# Patient Record
Sex: Male | Born: 1935 | Race: White | Hispanic: No | Marital: Married | Smoking: Never smoker
Health system: Southern US, Community
[De-identification: ages and names within clinical notes are randomized; demographics above are authoritative.]

## PROBLEM LIST (undated history)

## (undated) DIAGNOSIS — I10 Essential (primary) hypertension: Secondary | ICD-10-CM

## (undated) HISTORY — DX: Essential (primary) hypertension: I10

---

## 2000-04-16 ENCOUNTER — Encounter: Payer: Self-pay | Admitting: Thoracic Surgery (Cardiothoracic Vascular Surgery)

## 2000-04-20 ENCOUNTER — Encounter: Payer: Self-pay | Admitting: Thoracic Surgery (Cardiothoracic Vascular Surgery)

## 2000-04-20 ENCOUNTER — Inpatient Hospital Stay (HOSPITAL_COMMUNITY)
Admission: RE | Admit: 2000-04-20 | Discharge: 2000-04-25 | Payer: Self-pay | Admitting: Thoracic Surgery (Cardiothoracic Vascular Surgery)

## 2000-04-21 ENCOUNTER — Encounter: Payer: Self-pay | Admitting: Thoracic Surgery (Cardiothoracic Vascular Surgery)

## 2000-04-22 ENCOUNTER — Encounter: Payer: Self-pay | Admitting: Thoracic Surgery (Cardiothoracic Vascular Surgery)

## 2000-04-23 ENCOUNTER — Encounter: Payer: Self-pay | Admitting: Thoracic Surgery (Cardiothoracic Vascular Surgery)

## 2009-06-05 ENCOUNTER — Ambulatory Visit: Payer: Self-pay | Admitting: Gastroenterology

## 2009-07-12 ENCOUNTER — Ambulatory Visit: Payer: Self-pay | Admitting: Gastroenterology

## 2013-10-02 DIAGNOSIS — G4733 Obstructive sleep apnea (adult) (pediatric): Secondary | ICD-10-CM | POA: Insufficient documentation

## 2013-10-02 DIAGNOSIS — I10 Essential (primary) hypertension: Secondary | ICD-10-CM | POA: Insufficient documentation

## 2013-10-02 DIAGNOSIS — M069 Rheumatoid arthritis, unspecified: Secondary | ICD-10-CM | POA: Insufficient documentation

## 2013-10-02 DIAGNOSIS — N400 Enlarged prostate without lower urinary tract symptoms: Secondary | ICD-10-CM | POA: Insufficient documentation

## 2013-10-02 DIAGNOSIS — I2510 Atherosclerotic heart disease of native coronary artery without angina pectoris: Secondary | ICD-10-CM | POA: Insufficient documentation

## 2013-12-02 ENCOUNTER — Ambulatory Visit: Payer: Self-pay | Admitting: Rheumatology

## 2014-05-05 DIAGNOSIS — I700 Atherosclerosis of aorta: Secondary | ICD-10-CM | POA: Insufficient documentation

## 2014-12-01 ENCOUNTER — Encounter: Payer: Self-pay | Admitting: Podiatry

## 2014-12-01 ENCOUNTER — Ambulatory Visit (INDEPENDENT_AMBULATORY_CARE_PROVIDER_SITE_OTHER): Payer: Medicare Other | Admitting: Podiatry

## 2014-12-01 VITALS — BP 120/64 | HR 54 | Resp 16 | Ht 68.0 in | Wt 160.0 lb

## 2014-12-01 DIAGNOSIS — M79606 Pain in leg, unspecified: Secondary | ICD-10-CM

## 2014-12-01 DIAGNOSIS — L03031 Cellulitis of right toe: Secondary | ICD-10-CM

## 2014-12-01 DIAGNOSIS — M8080XA Other osteoporosis with current pathological fracture, unspecified site, initial encounter for fracture: Secondary | ICD-10-CM | POA: Insufficient documentation

## 2014-12-01 DIAGNOSIS — B351 Tinea unguium: Secondary | ICD-10-CM | POA: Diagnosis not present

## 2014-12-01 NOTE — Progress Notes (Signed)
   Subjective:    Patient ID: Philip Patel, male    DOB: December 13, 1922, 81 y.o.   MRN: [id number]  HPI  the right great toenail is real loose, i think i have a fungus , the nail gets caught up on my sock and it hurts real bad ,so i started wearing sandals   Review of Systems     Objective:   Physical Exam: I have reviewed his past history medications allergies surgery social history and review of systems. Pulses are strongly palpable. Neurologic sensorium is intact per Semmes-Weinstein monofilament. Deep tendon reflexes are intact bilaterally muscle strength was 5 over 5 dorsiflexion plantar flexors and inverters everters onto the musculatures intact. Mild flexible hammertoe deformities noted bilateral. Nails are thick yellow dystrophic onychomycotic and painful palpation as well as debridement. No open lesions no open wounds Infection of the toenail plates.        Assessment & Plan:  Assessment: Onychomycosis with pain in limb bilateral.  Plan: Debridement of nails of thickness and length. Follow-up with me as needed

## 2015-03-06 ENCOUNTER — Ambulatory Visit (INDEPENDENT_AMBULATORY_CARE_PROVIDER_SITE_OTHER): Payer: Medicare Other | Admitting: Podiatry

## 2015-03-06 DIAGNOSIS — M79606 Pain in leg, unspecified: Secondary | ICD-10-CM | POA: Diagnosis not present

## 2015-03-06 DIAGNOSIS — B351 Tinea unguium: Secondary | ICD-10-CM

## 2015-03-06 NOTE — Progress Notes (Signed)
Subjective: 81 y.o. returns the office today for painful, elongated, thickened toenails which he is unable to trim himself. Denies any redness or drainage around the nails. Denies any acute changes since last appointment and no new complaints today. Denies any systemic complaints such as fevers, chills, nausea, vomiting.   Objective: AAO 3, NAD DP/PT pulses palpable, CRT less than 3 seconds Nails hypertrophic, dystrophic, elongated, brittle, discolored 10. There is tenderness overlying the nails 1-5 bilaterally. There is no surrounding erythema or drainage along the nail sites. No open lesions or pre-ulcerative lesions are identified. No other areas of tenderness bilateral lower extremities. No overlying edema, erythema, increased warmth. No pain with calf compression, swelling, warmth, erythema.  Assessment: Patient presents with symptomatic onychomycosis  Plan: -Treatment options including alternatives, risks, complications were discussed -Nails sharply debrided 10 without complication/bleeding. -Discussed daily foot inspection. If there are any changes, to call the office immediately.  -Follow-up in 3 months or sooner if any problems are to arise. In the meantime, encouraged to call the office with any questions, concerns, changes symptoms.  Ovid Curd, DPM

## 2015-06-05 ENCOUNTER — Encounter: Payer: Self-pay | Admitting: Podiatry

## 2015-06-05 ENCOUNTER — Ambulatory Visit (INDEPENDENT_AMBULATORY_CARE_PROVIDER_SITE_OTHER): Payer: Medicare Other | Admitting: Podiatry

## 2015-06-05 DIAGNOSIS — B351 Tinea unguium: Secondary | ICD-10-CM | POA: Diagnosis not present

## 2015-06-05 DIAGNOSIS — M79606 Pain in leg, unspecified: Secondary | ICD-10-CM | POA: Diagnosis not present

## 2015-06-05 NOTE — Progress Notes (Signed)
Subjective: 81 y.o. returns the office today for painful, elongated, thickened toenails which he is unable to trim himself. Denies any redness or drainage around the nails. Denies any acute changes since last appointment and no new complaints today. Denies any systemic complaints such as fevers, chills, nausea, vomiting.   Objective: AAO 3, NAD DP/PT pulses palpable, CRT less than 3 seconds Nails hypertrophic, dystrophic, elongated, brittle, discolored 10. There is tenderness overlying the nails 1-5 bilaterally. There is no surrounding erythema or drainage along the nail sites. No open lesions or pre-ulcerative lesions are identified. No other areas of tenderness bilateral lower extremities. No overlying edema, erythema, increased warmth. No pain with calf compression, swelling, warmth, erythema.  Assessment: Patient presents with symptomatic onychomycosis  Plan: -Treatment options including alternatives, risks, complications were discussed -Nails sharply debrided 10 without complication/bleeding. -Discussed daily foot inspection. If there are any changes, to call the office immediately.  -Follow-up in 3 months or sooner if any problems are to arise. In the meantime, encouraged to call the office with any questions, concerns, changes symptoms.  Matthew Wagoner, DPM  

## 2015-09-04 ENCOUNTER — Encounter: Payer: Self-pay | Admitting: Podiatry

## 2015-09-04 ENCOUNTER — Ambulatory Visit (INDEPENDENT_AMBULATORY_CARE_PROVIDER_SITE_OTHER): Payer: Medicare Other | Admitting: Podiatry

## 2015-09-04 DIAGNOSIS — M79606 Pain in leg, unspecified: Secondary | ICD-10-CM | POA: Diagnosis not present

## 2015-09-04 DIAGNOSIS — B351 Tinea unguium: Secondary | ICD-10-CM | POA: Diagnosis not present

## 2015-09-04 NOTE — Progress Notes (Signed)
Patient ID: Philip Patel, male   DOB: 1922-09-04, 82 y.o.   MRN: [id number]  Subjective: 82 y.o. returns the office today for painful, elongated, thickened toenails which he is unable to trim himself. Denies any redness or drainage around the nails. Denies any acute changes since last appointment and no new complaints today. Denies any systemic complaints such as fevers, chills, nausea, vomiting.   Objective: AAO 3, NAD DP/PT pulses palpable, CRT less than 3 seconds Nails hypertrophic, dystrophic, elongated, brittle, discolored 10. There is tenderness overlying the nails 1-5 bilaterally. There is no surrounding erythema or drainage along the nail sites. No open lesions or pre-ulcerative lesions are identified. No other areas of tenderness bilateral lower extremities. No overlying edema, erythema, increased warmth. No pain with calf compression, swelling, warmth, erythema.  Assessment: Patient presents with symptomatic onychomycosis  Plan: -Treatment options including alternatives, risks, complications were discussed -Nails sharply debrided 10 without complication/bleeding. -Discussed daily foot inspection. If there are any changes, to call the office immediately.  -Follow-up in 3 months or sooner if any problems are to arise. In the meantime, encouraged to call the office with any questions, concerns, changes symptoms.  Ovid Curd, DPM

## 2015-12-11 ENCOUNTER — Ambulatory Visit (INDEPENDENT_AMBULATORY_CARE_PROVIDER_SITE_OTHER): Payer: Medicare Other | Admitting: Podiatry

## 2015-12-11 ENCOUNTER — Encounter: Payer: Self-pay | Admitting: Podiatry

## 2015-12-11 DIAGNOSIS — M79606 Pain in leg, unspecified: Secondary | ICD-10-CM | POA: Diagnosis not present

## 2015-12-11 DIAGNOSIS — B351 Tinea unguium: Secondary | ICD-10-CM | POA: Diagnosis not present

## 2015-12-11 NOTE — Progress Notes (Signed)
Patient ID: Philip Patel, male   DOB: 09/01/1922, 82 y.o.   MRN: [id number]  Subjective: 82 y.o. returns the office today for painful, elongated, thickened toenails which he is unable to trim himself. Denies any redness or drainage around the nails. Denies any acute changes since last appointment and no new complaints today. Denies any systemic complaints such as fevers, chills, nausea, vomiting.   Objective: AAO 3, NAD DP/PT pulses palpable, CRT less than 3 seconds Nails hypertrophic, dystrophic, elongated, brittle, discolored 10. There is tenderness overlying the nails 1-5 bilaterally. There is no surrounding erythema or drainage along the nail sites. No open lesions or pre-ulcerative lesions are identified. No other areas of tenderness bilateral lower extremities. No overlying edema, erythema, increased warmth. No pain with calf compression, swelling, warmth, erythema.  Assessment: Patient presents with symptomatic onychomycosis  Plan: -Treatment options including alternatives, risks, complications were discussed -Nails sharply debrided 10 without complication/bleeding. -Discussed daily foot inspection. If there are any changes, to call the office immediately.  -Follow-up in 3 months or sooner if any problems are to arise. In the meantime, encouraged to call the office with any questions, concerns, changes symptoms.  Matthew Wagoner, DPM  

## 2016-02-08 ENCOUNTER — Encounter: Payer: Self-pay | Admitting: Emergency Medicine

## 2016-02-08 ENCOUNTER — Emergency Department
Admission: EM | Admit: 2016-02-08 | Discharge: 2016-02-08 | Disposition: A | Discharge status: Home / Self Care | Payer: Medicare Other | Attending: Emergency Medicine | Admitting: Emergency Medicine

## 2016-02-08 DIAGNOSIS — Z79899 Other long term (current) drug therapy: Secondary | ICD-10-CM | POA: Diagnosis not present

## 2016-02-08 DIAGNOSIS — S00512A Abrasion of oral cavity, initial encounter: Secondary | ICD-10-CM | POA: Diagnosis not present

## 2016-02-08 DIAGNOSIS — Y939 Activity, unspecified: Secondary | ICD-10-CM | POA: Insufficient documentation

## 2016-02-08 DIAGNOSIS — I10 Essential (primary) hypertension: Secondary | ICD-10-CM | POA: Insufficient documentation

## 2016-02-08 DIAGNOSIS — Z7982 Long term (current) use of aspirin: Secondary | ICD-10-CM | POA: Diagnosis not present

## 2016-02-08 DIAGNOSIS — K1120 Sialoadenitis, unspecified: Secondary | ICD-10-CM | POA: Diagnosis not present

## 2016-02-08 DIAGNOSIS — Y929 Unspecified place or not applicable: Secondary | ICD-10-CM | POA: Diagnosis not present

## 2016-02-08 DIAGNOSIS — X58XXXA Exposure to other specified factors, initial encounter: Secondary | ICD-10-CM | POA: Diagnosis not present

## 2016-02-08 DIAGNOSIS — R220 Localized swelling, mass and lump, head: Secondary | ICD-10-CM | POA: Diagnosis present

## 2016-02-08 DIAGNOSIS — Y999 Unspecified external cause status: Secondary | ICD-10-CM | POA: Insufficient documentation

## 2016-02-08 NOTE — ED Notes (Addendum)
Pt reports that his tongue is sore and causing him to have difficulty swallowing and eating for the last few weeks - pt denies sore throat - pt denies shortness of breath - pt is A&O x4 and respirations are even and unlabored - pt tongue on assessment appears swollen (more on right side) but no discoloration noted

## 2016-02-08 NOTE — ED Triage Notes (Signed)
Pt c/o tongue swelling. Started a couple weeks ago but got significantly worse today. Pt drooling while attempting to talk to RN. Sounds like difficult for pt to talk. He reports it is a little hard r/t tongue. No respiratory distress.

## 2016-02-08 NOTE — ED Provider Notes (Signed)
[facility] Emergency Department Provider Note  ____________________________________________  Time seen: Approximately 6:16 PM  I have reviewed the triage vital signs and the nursing notes.   HISTORY  Chief Complaint Oral Swelling    HPI Philip Patel is a 82 y.o. male who complains of swelling under his tongue for the past 3 or 4 days. It feels like its getting worse. He also has cut his tongue on his teeth and that makes it really sore. He is able to eat soup but eating hard foods is not comfortable. No trouble breathing. No throat pain or difficulty swallowing. No drooling. No vomiting. No fevers or chills. No chest pain or shortness of breath.  Review of the electronic medical record reveals the patient was seen by his primary care doctor twice in the last week. He was seen a week ago and prescribed Levaquin for bronchitis. He then developed thrush and was seen 3 days later on August 14 was given nystatin. The patient reports that he stopped taking the Levaquin because he didn't like that he gave him thrush.     Past Medical History:  Diagnosis Date  . Hypertension      Patient Active Problem List   Diagnosis Date Noted  . Osteoporosis with fracture 12/01/2014  . Abdominal aortic atherosclerosis (HCC) 05/05/2014  . Benign fibroma of prostate 10/02/2013  . Arteriosclerosis of coronary artery 10/02/2013  . Benign essential HTN 10/02/2013  . Obstructive apnea 10/02/2013  . Arthritis or polyarthritis, rheumatoid (HCC) 10/02/2013     History reviewed. No pertinent surgical history.   Prior to Admission medications   Medication Sig Start Date End Date Taking? Authorizing Provider  alendronate (FOSAMAX) 70 MG tablet  10/09/14   Historical Provider, MD  aspirin EC 81 MG tablet Take by mouth.    Historical Provider, MD  Cholecalciferol (D 2000) 2000 UNITS TABS Take by mouth.    Historical Provider, MD  lisinopril (PRINIVIL,ZESTRIL) 5 MG tablet   10/09/14   Historical Provider, MD  lovastatin (MEVACOR) 40 MG tablet  09/04/14   Historical Provider, MD  methotrexate (RHEUMATREX) 2.5 MG tablet  11/13/14   Historical Provider, MD  metoprolol tartrate (LOPRESSOR) 25 MG tablet  09/25/14   Historical Provider, MD  pantoprazole (PROTONIX) 40 MG tablet  11/13/14   Historical Provider, MD     Allergies Penicillin v potassium   History reviewed. No pertinent family history.  Social History Social History  Substance Use Topics  . Smoking status: Never Smoker  . Smokeless tobacco: Not on file  . Alcohol use Not on file    Review of Systems  Constitutional:   No fever or chills.  ENT:   Pain in right tongue. Cardiovascular:   No chest pain. Respiratory:   No dyspnea or cough. Gastrointestinal:   Negative for abdominal pain, vomiting and diarrhea.  . Musculoskeletal:   Negative for focal pain or swelling  10-point ROS otherwise negative.  ____________________________________________   PHYSICAL EXAM:  VITAL SIGNS: ED Triage Vitals [02/08/16 1730]  Enc Vitals Group     BP 122/63     Pulse Rate 68     Resp 16     Temp 97.9 F (36.6 C)     Temp Source Oral     SpO2 100 %     Weight 150 lb (68 kg)     Height 5\' 8"  (1.727 m)     Head Circumference      Peak Flow  Pain Score 8     Pain Loc      Pain Edu?      Excl. in GC?     Vital signs reviewed, nursing assessments reviewed.   Constitutional:   Alert and oriented. Well appearing and in no distress. Eyes:   No scleral icterus. No conjunctival pallor. PERRL. EOMI.  No nystagmus. ENT   Head:   Normocephalic and atraumatic.   Nose:   No congestion/rhinnorhea. No septal hematoma   Mouth/Throat:   MMM, no pharyngeal erythema. No peritonsillar mass. Uvula is surgically absent. Diffuse dental decay, no swelling fluctuance or drainage from the gingiva. The right Wharton's duct is slightly swollen with watery edema. No induration. No purulent drainage. No palpable  stone. It is tender to the touch. It is no elevation of the tongue. There are some slight abrasions to the bottom of the tongue distally without laceration or inflammatory changes.   Neck:   No stridor. No SubQ emphysema. No meningismus. Palpation of the soft tissues under the jaw soft without any swelling or asymmetry. Hematological/Lymphatic/Immunilogical:   No cervical lymphadenopathy. Cardiovascular:   RRR. Symmetric bilateral radial and DP pulses.  No murmurs.  Respiratory:   Normal respiratory effort without tachypnea nor retractions. Breath sounds are clear and equal bilaterally. No wheezes/rales/rhonchi. Gastrointestinal:   Soft and nontender. Non distended. There is no CVA tenderness.  No rebound, rigidity, or guarding. Genitourinary:   deferred Musculoskeletal:   Nontender with normal range of motion in all extremities. No joint effusions.  No lower extremity tenderness.  No edema. Neurologic:   Normal speech and language.  CN 2-10 normal. Motor grossly intact. No gross focal neurologic deficits are appreciated.  Skin:    Skin is warm, dry and intact. No rash noted.  No petechiae, purpura, or bullae.  ____________________________________________    LABS (pertinent positives/negatives) (all labs ordered are listed, but only abnormal results are displayed) Labs Reviewed - No data to display ____________________________________________   EKG    ____________________________________________    RADIOLOGY    ____________________________________________   PROCEDURES Procedures  ____________________________________________   INITIAL IMPRESSION / ASSESSMENT AND PLAN / ED COURSE  Pertinent labs & imaging results that were available during my care of the patient were reviewed by me and considered in my medical decision making (see chart for details).  Patient very well-appearing no acute distress. Presents with pain in the floor of the mouth consistent with  sialoadenitis. No evidence of RPA PTA Ludwig angina. No evidence of any abscess. No facial cellulitis. Levaquin should cover any potential infectious agents causing edema but I think that this is primarily mechanical, possible small sialolithiasis. Patient counseled on warm tea, lemon candy, continue Levaquin, follow up with primary care, follow up with ENT in a week if symptoms have not resolved by then. Return to ED if anything worsens. Usual return precautions given. No evidence of any airway compromise.     Clinical Course   ____________________________________________   FINAL CLINICAL IMPRESSION(S) / ED DIAGNOSES  Final diagnoses:  Sialadenitis  Abrasion of tongue, initial encounter       Portions of this note were generated with dragon dictation software. Dictation errors may occur despite best attempts at proofreading.    Sharman Cheek, MD 02/08/16 [phone]

## 2016-02-08 NOTE — Discharge Instructions (Signed)
Continue the Levaquin and nystatin prescribed by your doctor about a week ago. Drink hot tea, and suck on hard lemonade candies to promote increased blood flow to the inflamed area of your salivary gland under the tongue and increased saliva production to help flush out the duct. If symptoms do not resolve in the next 5 days, follow up with ENT.

## 2016-02-29 ENCOUNTER — Emergency Department
Admission: EM | Admit: 2016-02-29 | Discharge: 2016-02-29 | Disposition: A | Discharge status: Home / Self Care | Payer: Medicare Other | Attending: Emergency Medicine | Admitting: Emergency Medicine

## 2016-02-29 ENCOUNTER — Encounter: Payer: Self-pay | Admitting: Emergency Medicine

## 2016-02-29 DIAGNOSIS — I2510 Atherosclerotic heart disease of native coronary artery without angina pectoris: Secondary | ICD-10-CM | POA: Insufficient documentation

## 2016-02-29 DIAGNOSIS — Z7982 Long term (current) use of aspirin: Secondary | ICD-10-CM | POA: Insufficient documentation

## 2016-02-29 DIAGNOSIS — R197 Diarrhea, unspecified: Secondary | ICD-10-CM | POA: Diagnosis not present

## 2016-02-29 DIAGNOSIS — I10 Essential (primary) hypertension: Secondary | ICD-10-CM | POA: Insufficient documentation

## 2016-02-29 LAB — URINALYSIS COMPLETE WITH MICROSCOPIC (ARMC ONLY)
BILIRUBIN URINE: NEGATIVE
Bacteria, UA: NONE SEEN
GLUCOSE, UA: NEGATIVE mg/dL
Hgb urine dipstick: NEGATIVE
KETONES UR: NEGATIVE mg/dL
Leukocytes, UA: NEGATIVE
NITRITE: NEGATIVE
Protein, ur: NEGATIVE mg/dL
SPECIFIC GRAVITY, URINE: 1.012 (ref 1.005–1.030)
Squamous Epithelial / LPF: NONE SEEN
WBC, UA: NONE SEEN WBC/hpf (ref 0–5)
pH: 5 (ref 5.0–8.0)

## 2016-02-29 LAB — CBC
HCT: 30 % — ABNORMAL LOW (ref 40.0–52.0)
HEMOGLOBIN: 10.3 g/dL — AB (ref 13.0–18.0)
MCH: 34.5 pg — AB (ref 26.0–34.0)
MCHC: 34.3 g/dL (ref 32.0–36.0)
MCV: 100.8 fL — ABNORMAL HIGH (ref 80.0–100.0)
PLATELETS: 336 10*3/uL (ref 150–440)
RBC: 2.98 MIL/uL — ABNORMAL LOW (ref 4.40–5.90)
RDW: 19 % — ABNORMAL HIGH (ref 11.5–14.5)
WBC: 10.8 10*3/uL — ABNORMAL HIGH (ref 3.8–10.6)

## 2016-02-29 LAB — COMPREHENSIVE METABOLIC PANEL
ALK PHOS: 88 U/L (ref 38–126)
ALT: 12 U/L — AB (ref 17–63)
ANION GAP: 7 (ref 5–15)
AST: 25 U/L (ref 15–41)
Albumin: 3.4 g/dL — ABNORMAL LOW (ref 3.5–5.0)
BILIRUBIN TOTAL: 0.8 mg/dL (ref 0.3–1.2)
BUN: 25 mg/dL — ABNORMAL HIGH (ref 6–20)
CALCIUM: 8.8 mg/dL — AB (ref 8.9–10.3)
CO2: 23 mmol/L (ref 22–32)
CREATININE: 0.92 mg/dL (ref 0.61–1.24)
Chloride: 103 mmol/L (ref 101–111)
GFR calc Af Amer: >60 mL/min (ref >60)
GFR calc non Af Amer: >60 mL/min (ref >60)
GLUCOSE: 100 mg/dL — AB (ref 65–99)
Potassium: 4.4 mmol/L (ref 3.5–5.1)
SODIUM: 133 mmol/L — AB (ref 135–145)
TOTAL PROTEIN: 7.1 g/dL (ref 6.5–8.1)

## 2016-02-29 LAB — LIPASE, BLOOD: Lipase: 30 U/L (ref 11–51)

## 2016-02-29 NOTE — ED Provider Notes (Signed)
[facility] Emergency Department Provider Note ____________________________________________   I have reviewed the triage vital signs and the triage nursing note.  HISTORY  Chief Complaint Diarrhea   Historian Patient and wife  HPI Philip Patel is a 82 y.o. male here after calling gi physician and referred to ED for ongoing complaint of diarrhea.  He was being seen for approx 4 weeks of watery diarrhea -- no blood, and no abdominal pain, but multiple episodes per day.  Reports he was started on a diarrhea medicine about 3 days ago and this morning after having continued diarrhea, he called the office, and was told to go to the ER for further evaluation. However now he states that the last few bowel movements is had today have actually been a little firmer. No fever. No chest pain. No nausea or vomiting.    Past Medical History:  Diagnosis Date  . Hypertension     Patient Active Problem List   Diagnosis Date Noted  . Osteoporosis with fracture 12/01/2014  . Abdominal aortic atherosclerosis (HCC) 05/05/2014  . Benign fibroma of prostate 10/02/2013  . Arteriosclerosis of coronary artery 10/02/2013  . Benign essential HTN 10/02/2013  . Obstructive apnea 10/02/2013  . Arthritis or polyarthritis, rheumatoid (HCC) 10/02/2013    History reviewed. No pertinent surgical history.  Prior to Admission medications   Medication Sig Start Date End Date Taking? Authorizing Provider  alendronate (FOSAMAX) 70 MG tablet  10/09/14   Historical Provider, MD  aspirin EC 81 MG tablet Take by mouth.    Historical Provider, MD  Cholecalciferol (D 2000) 2000 UNITS TABS Take by mouth.    Historical Provider, MD  lisinopril (PRINIVIL,ZESTRIL) 5 MG tablet  10/09/14   Historical Provider, MD  lovastatin (MEVACOR) 40 MG tablet  09/04/14   Historical Provider, MD  methotrexate (RHEUMATREX) 2.5 MG tablet  11/13/14   Historical Provider, MD  metoprolol tartrate (LOPRESSOR) 25 MG  tablet  09/25/14   Historical Provider, MD  pantoprazole (PROTONIX) 40 MG tablet  11/13/14   Historical Provider, MD    Allergies  Allergen Reactions  . Penicillin V Potassium Rash    No family history on file.  Social History Social History  Substance Use Topics  . Smoking status: Never Smoker  . Smokeless tobacco: Never Used  . Alcohol use No    Review of Systems  Constitutional: Negative for fever. Eyes: Negative for visual changes. ENT: Negative for sore throat. Cardiovascular: Negative for chest pain. Respiratory: Negative for shortness of breath. Gastrointestinal: Negative for abdominal pain. Genitourinary: Negative for dysuria. Musculoskeletal: Negative for back pain. Skin: Negative for rash. Neurological: Negative for headache. 10 point Review of Systems otherwise negative ____________________________________________   PHYSICAL EXAM:  VITAL SIGNS: ED Triage Vitals  Enc Vitals Group     BP 02/29/16 1515 125/63     Pulse Rate 02/29/16 1515 82     Resp 02/29/16 1515 20     Temp 02/29/16 1515 97.7 F (36.5 C)     Temp Source 02/29/16 1515 Oral     SpO2 02/29/16 1515 96 %     Weight 02/29/16 1514 150 lb (68 kg)     Height 02/29/16 1514 5\' 8"  (1.727 m)     Head Circumference --      Peak Flow --      Pain Score 02/29/16 1935 0     Pain Loc --      Pain Edu? --      Excl. in GC? --  Constitutional: Alert and oriented. Well appearing and in no distress.A little hard of hearing.  HEENT   Head: Normocephalic and atraumatic.      Eyes: Conjunctivae are normal. PERRL. Normal extraocular movements.      Ears:         Nose: No congestion/rhinnorhea.   Mouth/Throat: Mucous membranes are moist.   Neck: No stridor. Cardiovascular/Chest: Normal rate, regular rhythm.  No murmurs, rubs, or gallops. Respiratory: Normal respiratory effort without tachypnea nor retractions. Breath sounds are clear and equal bilaterally. No  wheezes/rales/rhonchi. Gastrointestinal: Soft. No distention, no guarding, no rebound. Nontender.    Genitourinary/rectal:Deferred Musculoskeletal: Nontender with normal range of motion in all extremities. No joint effusions.  No lower extremity tenderness.  No edema. Neurologic:  Normal speech and language. No gross or focal neurologic deficits are appreciated. Skin:  Skin is warm, dry and intact. No rash noted. Psychiatric: Mood and affect are normal. Speech and behavior are normal. Patient exhibits appropriate insight and judgment.   ____________________________________________  LABS (pertinent positives/negatives)  Labs Reviewed  COMPREHENSIVE METABOLIC PANEL - Abnormal; Notable for the following:       Result Value   Sodium 133 (*)    Glucose, Bld 100 (*)    BUN 25 (*)    Calcium 8.8 (*)    Albumin 3.4 (*)    ALT 12 (*)    All other components within normal limits  CBC - Abnormal; Notable for the following:    WBC 10.8 (*)    RBC 2.98 (*)    Hemoglobin 10.3 (*)    HCT 30.0 (*)    MCV 100.8 (*)    MCH 34.5 (*)    RDW 19.0 (*)    All other components within normal limits  URINALYSIS COMPLETEWITH MICROSCOPIC ([facility] ONLY) - Abnormal; Notable for the following:    Color, Urine YELLOW (*)    APPearance CLEAR (*)    All other components within normal limits  LIPASE, BLOOD    ____________________________________________    EKG I, Governor Rooks, MD, the attending physician have personally viewed and interpreted all ECGs.  None ____________________________________________  RADIOLOGY All Xrays were viewed by me. Imaging interpreted by Radiologist.  None __________________________________________  PROCEDURES  Procedure(s) performed: None  Critical Care performed: None  ____________________________________________   ED COURSE / ASSESSMENT AND PLAN  Pertinent labs & imaging results that were available during my care of the patient were reviewed by me and  considered in my medical decision making (see chart for details).    Mr. Benincasa was referred by phone due to continued diarrhea, but since here reports no diarrhea - stools are firmer.  No fevers, no bloody stools, hemoglobin consistent with prior.  Minimally elevated wbc, uncertain significance.  Without fever, pain, or worsening anemia, I am not recommending ct imaging at this point.    UA negative.  Patient feels maybe new med is working now and ok for discharge home tonight.    CONSULTATIONS:   None   Patient / Family / Caregiver informed of clinical course, medical decision-making process, and agree with plan.   I discussed return precautions, follow-up instructions, and discharge instructions with patient and/or family.   ___________________________________________   FINAL CLINICAL IMPRESSION(S) / ED DIAGNOSES   Final diagnoses:  Diarrhea in adult patient              Note: This dictation was prepared with Dragon dictation. Any transcriptional errors that result from this process are unintentional    Lurena Joiner  Shaune Pollack, MD 02/29/16 [phone]

## 2016-02-29 NOTE — ED Triage Notes (Signed)
Presents with diarrhea for the past 3-4 weeks  Sent in by PCP for possible dehydration

## 2016-02-29 NOTE — ED Notes (Signed)
Pt states he started a new antibiotic yesterday and has been taking one prior to starting the new antibiotic. Pt also states he has had a cough for about a week that he was prescribed the antibiotic for.

## 2016-02-29 NOTE — Discharge Instructions (Signed)
You were evaluated tonight for ongoing diarrhea, urinary exam and evaluation are reassuring in the emergency department tonight.  As we discussed, return to the emergency room for any fever, black or bloody stool, concern for dehydration, abdominal pain, or any other symptoms concerning to you.  Please follow up with your primary care doctor next available, Monday or Tuesday. Please call the office to make the appointment.

## 2016-03-13 ENCOUNTER — Ambulatory Visit (INDEPENDENT_AMBULATORY_CARE_PROVIDER_SITE_OTHER): Payer: Medicare Other | Admitting: Podiatry

## 2016-03-13 ENCOUNTER — Encounter: Payer: Self-pay | Admitting: Podiatry

## 2016-03-13 DIAGNOSIS — B351 Tinea unguium: Secondary | ICD-10-CM

## 2016-03-13 DIAGNOSIS — M79606 Pain in leg, unspecified: Secondary | ICD-10-CM | POA: Diagnosis not present

## 2016-03-13 NOTE — Progress Notes (Signed)
Patient ID: Philip Patel, male   DOB: 06/18/1923, 82 y.o.   MRN: [id number]  Subjective: 82 y.o. returns the office today for painful, elongated, thickened toenails which he is unable to trim himself. Denies any redness or drainage around the nails. Denies any acute changes since last appointment and no new complaints today. Denies any systemic complaints such as fevers, chills, nausea, vomiting.   Objective: AAO 3, NAD DP/PT pulses palpable, CRT less than 3 seconds Nails hypertrophic, dystrophic, elongated, brittle, discolored 10. There is tenderness overlying the nails 1-5 bilaterally. The left hallux toenail is very loosely underlying nail bed and only attached the very proximal nail border. The toenail came off during debridement. There is no surrounding erythema or drainage along the nail sites. No open lesions or pre-ulcerative lesions are identified. No other areas of tenderness bilateral lower extremities. No overlying edema, erythema, increased warmth. No pain with calf compression, swelling, warmth, erythema.  Assessment: Patient presents with symptomatic onychomycosis  Plan: -Treatment options including alternatives, risks, complications were discussed -Nails sharply debrided 10 without complication/bleeding. -Discussed daily foot inspection. If there are any changes, to call the office immediately.  -Follow-up in 3 months or sooner if any problems are to arise. In the meantime, encouraged to call the office with any questions, concerns, changes symptoms.  Ovid Curd, DPM

## 2016-03-19 ENCOUNTER — Emergency Department
Admission: EM | Admit: 2016-03-19 | Discharge: 2016-03-23 | Disposition: E | Payer: No Typology Code available for payment source | Attending: Student in an Organized Health Care Education/Training Program | Admitting: Student in an Organized Health Care Education/Training Program

## 2016-03-19 ENCOUNTER — Encounter: Payer: Self-pay | Admitting: Emergency Medicine

## 2016-03-19 ENCOUNTER — Emergency Department: Payer: No Typology Code available for payment source

## 2016-03-19 DIAGNOSIS — I2510 Atherosclerotic heart disease of native coronary artery without angina pectoris: Secondary | ICD-10-CM | POA: Insufficient documentation

## 2016-03-19 DIAGNOSIS — I10 Essential (primary) hypertension: Secondary | ICD-10-CM | POA: Insufficient documentation

## 2016-03-19 DIAGNOSIS — J95821 Acute postprocedural respiratory failure: Secondary | ICD-10-CM

## 2016-03-19 DIAGNOSIS — Y92410 Unspecified street and highway as the place of occurrence of the external cause: Secondary | ICD-10-CM | POA: Diagnosis not present

## 2016-03-19 DIAGNOSIS — R579 Shock, unspecified: Secondary | ICD-10-CM | POA: Insufficient documentation

## 2016-03-19 DIAGNOSIS — T148 Other injury of unspecified body region: Secondary | ICD-10-CM | POA: Diagnosis not present

## 2016-03-19 DIAGNOSIS — Z79899 Other long term (current) drug therapy: Secondary | ICD-10-CM | POA: Diagnosis not present

## 2016-03-19 DIAGNOSIS — Y9389 Activity, other specified: Secondary | ICD-10-CM | POA: Insufficient documentation

## 2016-03-19 DIAGNOSIS — R55 Syncope and collapse: Secondary | ICD-10-CM | POA: Diagnosis present

## 2016-03-19 DIAGNOSIS — Y999 Unspecified external cause status: Secondary | ICD-10-CM | POA: Diagnosis not present

## 2016-03-19 DIAGNOSIS — Z7982 Long term (current) use of aspirin: Secondary | ICD-10-CM | POA: Insufficient documentation

## 2016-03-19 DIAGNOSIS — Z9889 Other specified postprocedural states: Secondary | ICD-10-CM

## 2016-03-19 DIAGNOSIS — T07XXXA Unspecified multiple injuries, initial encounter: Secondary | ICD-10-CM

## 2016-03-19 LAB — CBC WITH DIFFERENTIAL/PLATELET
Basophils Absolute: 0.1 10*3/uL (ref 0–0.1)
Basophils Relative: 1 %
EOS ABS: 0.1 10*3/uL (ref 0–0.7)
EOS PCT: 2 %
HCT: 34.5 % — ABNORMAL LOW (ref 40.0–52.0)
Hemoglobin: 12 g/dL — ABNORMAL LOW (ref 13.0–18.0)
LYMPHS ABS: 1.8 10*3/uL (ref 1.0–3.6)
Lymphocytes Relative: 22 %
MCH: 35.1 pg — AB (ref 26.0–34.0)
MCHC: 34.6 g/dL (ref 32.0–36.0)
MCV: 101.5 fL — ABNORMAL HIGH (ref 80.0–100.0)
MONO ABS: 0.5 10*3/uL (ref 0.2–1.0)
Monocytes Relative: 6 %
Neutro Abs: 5.6 10*3/uL (ref 1.4–6.5)
Neutrophils Relative %: 69 %
PLATELETS: 157 10*3/uL (ref 150–440)
RBC: 3.4 MIL/uL — AB (ref 4.40–5.90)
RDW: 17.3 % — ABNORMAL HIGH (ref 11.5–14.5)
WBC: 8 10*3/uL (ref 3.8–10.6)

## 2016-03-19 LAB — COMPREHENSIVE METABOLIC PANEL
ALBUMIN: 3.5 g/dL (ref 3.5–5.0)
ALT: 18 U/L (ref 17–63)
AST: 33 U/L (ref 15–41)
Alkaline Phosphatase: 88 U/L (ref 38–126)
Anion gap: 9 (ref 5–15)
BUN: 21 mg/dL — ABNORMAL HIGH (ref 6–20)
CHLORIDE: 100 mmol/L — AB (ref 101–111)
CO2: 25 mmol/L (ref 22–32)
CREATININE: 1.11 mg/dL (ref 0.61–1.24)
Calcium: 9.1 mg/dL (ref 8.9–10.3)
GFR calc non Af Amer: 56 mL/min — ABNORMAL LOW (ref >60)
Glucose, Bld: 121 mg/dL — ABNORMAL HIGH (ref 65–99)
Potassium: 4.5 mmol/L (ref 3.5–5.1)
SODIUM: 134 mmol/L — AB (ref 135–145)
Total Bilirubin: 0.9 mg/dL (ref 0.3–1.2)
Total Protein: 7.5 g/dL (ref 6.5–8.1)

## 2016-03-19 LAB — URINE DRUG SCREEN, QUALITATIVE (ARMC ONLY)
Amphetamines, Ur Screen: NOT DETECTED
BENZODIAZEPINE, UR SCRN: NOT DETECTED
Barbiturates, Ur Screen: NOT DETECTED
CANNABINOID 50 NG, UR ~~LOC~~: NOT DETECTED
Cocaine Metabolite,Ur ~~LOC~~: NOT DETECTED
MDMA (Ecstasy)Ur Screen: NOT DETECTED
Methadone Scn, Ur: NOT DETECTED
OPIATE, UR SCREEN: NOT DETECTED
PHENCYCLIDINE (PCP) UR S: NOT DETECTED
Tricyclic, Ur Screen: NOT DETECTED

## 2016-03-19 LAB — ABO/RH: ABO/RH(D): O POS

## 2016-03-19 LAB — PROTIME-INR
INR: 1.16
Prothrombin Time: 14.9 seconds (ref 11.4–15.2)

## 2016-03-19 MED ORDER — FENTANYL CITRATE (PF) 100 MCG/2ML IJ SOLN
100.0000 ug | Freq: Once | INTRAMUSCULAR | Status: AC
  Administered 2016-03-19: 100 ug via INTRAVENOUS

## 2016-03-19 MED ORDER — FENTANYL CITRATE (PF) 100 MCG/2ML IJ SOLN
50.0000 ug | Freq: Once | INTRAMUSCULAR | Status: AC
  Administered 2016-03-19: 50 ug via INTRAVENOUS

## 2016-03-19 MED ORDER — IOPAMIDOL (ISOVUE-300) INJECTION 61%: 100.0000 mL | Freq: Once | INTRAVENOUS | Status: DC | PRN

## 2016-03-19 MED ORDER — SUCCINYLCHOLINE CHLORIDE 20 MG/ML IJ SOLN
100.0000 mg | Freq: Once | INTRAMUSCULAR | Status: AC
  Administered 2016-03-19: 100 mg via INTRAVENOUS

## 2016-03-19 MED ORDER — ETOMIDATE 2 MG/ML IV SOLN
20.0000 mg | Freq: Once | INTRAVENOUS | Status: AC
  Administered 2016-03-19: 20 mg via INTRAVENOUS

## 2016-03-19 MED ORDER — NOREPINEPHRINE 4 MG/250ML-% IV SOLN
INTRAVENOUS | Status: AC
  Administered 2016-03-19: 10 ug/min via INTRAVENOUS
  Filled 2016-03-19: qty 250

## 2016-03-19 MED ORDER — MIDAZOLAM HCL 2 MG/2ML IJ SOLN: 2.0000 mg | Freq: Once | INTRAMUSCULAR | Status: DC

## 2016-03-19 MED ORDER — FENTANYL 2500MCG IN NS 250ML (10MCG/ML) PREMIX INFUSION
20.0000 ug/h | INTRAVENOUS | Status: DC
  Filled 2016-03-19: qty 250

## 2016-03-19 MED ORDER — NALOXONE HCL 2 MG/2ML IJ SOSY: 0.4000 mg | PREFILLED_SYRINGE | Freq: Once | INTRAMUSCULAR | Status: DC

## 2016-03-19 MED ORDER — NOREPINEPHRINE BITARTRATE 1 MG/ML IV SOLN
10.0000 ug/min | INTRAVENOUS | Status: DC
  Filled 2016-03-19: qty 4

## 2016-03-19 MED ORDER — NALOXONE HCL 2 MG/2ML IJ SOSY
PREFILLED_SYRINGE | INTRAMUSCULAR | Status: AC
  Filled 2016-03-19: qty 2

## 2016-03-19 MED ORDER — SODIUM CHLORIDE 0.9 % IV BOLUS (SEPSIS)
2000.0000 mL | Freq: Once | INTRAVENOUS | Status: DC
Start: 2016-03-19 — End: 2016-03-20

## 2016-03-19 MED ORDER — NALOXONE HCL 2 MG/2ML IJ SOSY: 2.0000 mg | PREFILLED_SYRINGE | Freq: Once | INTRAMUSCULAR | Status: DC
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
## 2016-03-19 NOTE — ED Notes (Signed)
Patient has lost breathing sounds in his right lung, MD is going to start a chest tube.  This RN retrieved chest tube kit and pneumovac.

## 2016-03-19 NOTE — ED Notes (Addendum)
[facility] started patient blood transfusion since we already had the blood on hand.  They will infuse the first unit [id number] en route and will use the second unit [id number] en route if necessary.  Otherwise they will dispose of the unit.  O Pos blood verified with Crista RN, [facility] prior to administration.  Patient is being monitored every 2 minutes due to current status.

## 2016-03-19 NOTE — ED Notes (Signed)
Patient unable to get to CT due to hypotension.  Canceling orders per CT request.

## 2016-03-19 NOTE — ED Notes (Signed)
This RN taking patient to CT room 1.

## 2016-03-19 NOTE — ED Notes (Signed)
Patient seen struggling for breath, MD notified and decision to intubate was made.

## 2016-03-19 NOTE — ED Notes (Signed)
The decision was made to put this patient back on [facility], he will not be transported to [facility] after all.  RT notified to come hook him back up to our equipment.
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
## 2016-03-19 NOTE — ED Notes (Addendum)
Patient is on [facility]'s equipment, it is unable to pick up any vital signs.  MD at bedside and feels a faint pulse.  MD VORB 100 Fentanyl IV.  This RN will enter order and administer.

## 2016-03-19 NOTE — ED Notes (Signed)
XR at bedside

## 2016-03-19 NOTE — ED Notes (Signed)
DC Fentanyl drip order.  [facility] is here to get patient, they are going to give him Fentanyl IV push instead.  MD verbally OK'd dc of order.

## 2016-03-19 NOTE — ED Notes (Addendum)
MD ordered NORepinephrine 10 mcg/min IV, [facility] used their pump and tubing.

## 2016-03-19 NOTE — ED Notes (Signed)
Patient BP is at 70/60, notifying MD - CT will not be done at this time due to this.  VO received to give patient 2L fluid.

## 2016-03-20 DIAGNOSIS — T148 Other injury of unspecified body region: Secondary | ICD-10-CM | POA: Diagnosis not present

## 2016-03-20 LAB — TYPE AND SCREEN
ABO/RH(D): O POS
Antibody Screen: NEGATIVE
UNIT DIVISION: 0
UNIT DIVISION: 0

## 2016-03-20 MED ORDER — POLYVINYL ALCOHOL 1.4 % OP SOLN
1.0000 [drp] | Freq: Four times a day (QID) | OPHTHALMIC | Status: DC | PRN
  Filled 2016-03-20: qty 15

## 2016-03-20 MED ORDER — HALOPERIDOL 0.5 MG PO TABS
0.5000 mg | ORAL_TABLET | ORAL | Status: DC | PRN
  Filled 2016-03-20: qty 1

## 2016-03-20 MED ORDER — ONDANSETRON HCL 4 MG/2ML IJ SOLN: 4.0000 mg | Freq: Four times a day (QID) | INTRAMUSCULAR | Status: DC | PRN

## 2016-03-20 MED ORDER — HALOPERIDOL LACTATE 5 MG/ML IJ SOLN: 0.5000 mg | INTRAMUSCULAR | Status: DC | PRN

## 2016-03-20 MED ORDER — GLYCOPYRROLATE 0.2 MG/ML IJ SOLN
0.2000 mg | INTRAMUSCULAR | Status: DC | PRN
  Filled 2016-03-20: qty 1

## 2016-03-20 MED ORDER — MORPHINE SULFATE (PF) 2 MG/ML IV SOLN: 1.0000 mg | INTRAVENOUS | Status: DC | PRN

## 2016-03-20 MED ORDER — GLYCOPYRROLATE 1 MG PO TABS
1.0000 mg | ORAL_TABLET | ORAL | Status: DC | PRN
  Filled 2016-03-20: qty 1

## 2016-03-20 MED ORDER — ONDANSETRON 4 MG PO TBDP: 4.0000 mg | ORAL_TABLET | Freq: Four times a day (QID) | ORAL | Status: DC | PRN

## 2016-03-20 MED ORDER — ACETAMINOPHEN 650 MG RE SUPP: 650.0000 mg | Freq: Four times a day (QID) | RECTAL | Status: DC | PRN

## 2016-03-20 MED ORDER — ACETAMINOPHEN 325 MG PO TABS
650.0000 mg | ORAL_TABLET | Freq: Four times a day (QID) | ORAL | Status: DC | PRN
Start: 2016-03-20 — End: 2016-03-20

## 2016-03-20 MED ORDER — HALOPERIDOL LACTATE 2 MG/ML PO CONC
0.5000 mg | ORAL | Status: DC | PRN
  Filled 2016-03-20: qty 0.3

## 2016-03-21 LAB — PREPARE RBC (CROSSMATCH)

## 2016-03-23 DIAGNOSIS — 419620001 Death: Secondary | SNOMED CT | POA: Diagnosis not present

## 2016-03-23 MED FILL — Medication: Qty: 1 | Status: AC

## 2016-03-23 NOTE — ED Notes (Signed)
50 mcg of Fentanyl unable to be wasted in the system.  Medication visually verified to be wasted by Janeece Riggers, Romilda Joy, RN.

## 2016-03-23 NOTE — ED Notes (Addendum)
[employer] controller) assigned reference number [id number] to his case and stated due to his advanced age is not a candidate for any donation.

## 2016-03-23 NOTE — ED Notes (Signed)
Asked Melony Overly RN to ensure patient reached the morgue tonight.  The family is no longer present.  The funeral home form is with the patient so arrangements can be made with the family.

## 2016-03-23 NOTE — ED Notes (Signed)
Family stated patient was not a donor according to his license.

## 2016-03-23 NOTE — ED Notes (Signed)
Patient received both units of blood, administered through [facility] equipment.

## 2016-03-23 NOTE — ED Notes (Signed)
Patient has been changed back to [facility] equipment and [facility] left [facility].

## 2016-03-23 NOTE — ED Notes (Signed)
MD pronounced patient expired at bedside.

## 2016-03-23 NOTE — Progress Notes (Signed)
   03/19/2016 0100  Clinical Encounter Type  Visited With Family  Visit Type Death;ED  Referral From Nurse  Spiritual Encounters  Spiritual Needs Prayer;Emotional;Grief support  CH responded to page and consult for end of life/death; CH offered spiritual and grief support for family along with prayer; family requested additional time for daughter to arrive from [facility]; [facility] liaison with RN and provide support as required. Casimiro Needle I Khai Arrona 1:20 AM

## 2016-03-23 NOTE — ED Notes (Addendum)
Patient taken to [id number] for family viewing.  Chaplain is attending with family.

## 2016-03-23 NOTE — Progress Notes (Signed)
Placed patient back on [facility] vent per md request. No longer being transferred to outside facility.

## 2016-03-23 NOTE — ED Notes (Addendum)
Vital sign monitoring discontinued.

## 2016-03-23 NOTE — ED Notes (Signed)
Family and MD at bedside.

## 2016-03-23 NOTE — ED Notes (Signed)
Fritzi Mandes from [facility] called to verify the transport has been canceled.

## 2016-03-23 DEATH — deceased

## 2016-06-12 ENCOUNTER — Ambulatory Visit: Payer: Medicare Other | Admitting: Podiatry
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
